# Patient Record
Sex: Female | Born: 1950 | Race: Black or African American | Hispanic: No | Marital: Married | Smoking: Never smoker
Health system: Southern US, Community
[De-identification: ages and names within clinical notes are randomized; demographics above are authoritative.]

## PROBLEM LIST (undated history)

## (undated) DIAGNOSIS — K529 Noninfective gastroenteritis and colitis, unspecified: Secondary | ICD-10-CM

## (undated) DIAGNOSIS — K635 Polyp of colon: Secondary | ICD-10-CM

## (undated) DIAGNOSIS — I10 Essential (primary) hypertension: Secondary | ICD-10-CM

## (undated) DIAGNOSIS — E785 Hyperlipidemia, unspecified: Secondary | ICD-10-CM

## (undated) DIAGNOSIS — M5090 Cervical disc disorder, unspecified, unspecified cervical region: Secondary | ICD-10-CM

## (undated) DIAGNOSIS — K219 Gastro-esophageal reflux disease without esophagitis: Secondary | ICD-10-CM

## (undated) DIAGNOSIS — K222 Esophageal obstruction: Secondary | ICD-10-CM

## (undated) HISTORY — DX: Cervical disc disorder, unspecified, unspecified cervical region: M50.90

## (undated) HISTORY — PX: COLONOSCOPY: SHX174

## (undated) HISTORY — DX: Gastro-esophageal reflux disease without esophagitis: K21.9

## (undated) HISTORY — PX: APPENDECTOMY: SHX54

## (undated) HISTORY — DX: Hyperlipidemia, unspecified: E78.5

## (undated) HISTORY — PX: BREAST LUMPECTOMY: SHX2

## (undated) HISTORY — DX: Polyp of colon: K63.5

## (undated) HISTORY — PX: CHOLECYSTECTOMY: SHX55

## (undated) HISTORY — DX: Esophageal obstruction: K22.2

---

## 1998-07-02 ENCOUNTER — Ambulatory Visit (HOSPITAL_COMMUNITY): Admission: RE | Admit: 1998-07-02 | Discharge: 1998-07-02 | Payer: Self-pay | Admitting: Gastroenterology

## 1999-07-01 ENCOUNTER — Other Ambulatory Visit: Admission: RE | Admit: 1999-07-01 | Discharge: 1999-07-01 | Payer: Self-pay | Admitting: *Deleted

## 2000-11-29 ENCOUNTER — Other Ambulatory Visit: Admission: RE | Admit: 2000-11-29 | Discharge: 2000-11-29 | Payer: Self-pay | Admitting: Obstetrics and Gynecology

## 2002-01-04 ENCOUNTER — Other Ambulatory Visit: Admission: RE | Admit: 2002-01-04 | Discharge: 2002-01-04 | Payer: Self-pay | Admitting: Obstetrics and Gynecology

## 2002-07-22 ENCOUNTER — Ambulatory Visit (HOSPITAL_COMMUNITY): Admission: RE | Admit: 2002-07-22 | Discharge: 2002-07-22 | Payer: Self-pay | Admitting: Gastroenterology

## 2002-08-01 ENCOUNTER — Ambulatory Visit (HOSPITAL_COMMUNITY): Admission: RE | Admit: 2002-08-01 | Discharge: 2002-08-01 | Payer: Self-pay | Admitting: Gastroenterology

## 2003-01-27 ENCOUNTER — Ambulatory Visit (HOSPITAL_COMMUNITY): Admission: RE | Admit: 2003-01-27 | Discharge: 2003-01-27 | Payer: Self-pay | Admitting: Gastroenterology

## 2003-12-23 ENCOUNTER — Other Ambulatory Visit: Admission: RE | Admit: 2003-12-23 | Discharge: 2003-12-23 | Payer: Self-pay | Admitting: Obstetrics and Gynecology

## 2006-03-15 ENCOUNTER — Other Ambulatory Visit: Admission: RE | Admit: 2006-03-15 | Discharge: 2006-03-15 | Payer: Self-pay | Admitting: Obstetrics and Gynecology

## 2011-11-09 ENCOUNTER — Emergency Department (HOSPITAL_COMMUNITY)
Admission: EM | Admit: 2011-11-09 | Discharge: 2011-11-09 | Disposition: A | Discharge status: Home / Self Care | Payer: No Typology Code available for payment source | Attending: Emergency Medicine | Admitting: Emergency Medicine

## 2011-11-09 ENCOUNTER — Emergency Department (HOSPITAL_COMMUNITY): Payer: No Typology Code available for payment source

## 2011-11-09 ENCOUNTER — Encounter (HOSPITAL_COMMUNITY): Payer: Self-pay | Admitting: Emergency Medicine

## 2011-11-09 DIAGNOSIS — R42 Dizziness and giddiness: Secondary | ICD-10-CM | POA: Insufficient documentation

## 2011-11-09 DIAGNOSIS — I10 Essential (primary) hypertension: Secondary | ICD-10-CM | POA: Insufficient documentation

## 2011-11-09 DIAGNOSIS — S20219A Contusion of unspecified front wall of thorax, initial encounter: Secondary | ICD-10-CM

## 2011-11-09 DIAGNOSIS — R51 Headache: Secondary | ICD-10-CM | POA: Insufficient documentation

## 2011-11-09 DIAGNOSIS — S0990XA Unspecified injury of head, initial encounter: Secondary | ICD-10-CM

## 2011-11-09 DIAGNOSIS — E119 Type 2 diabetes mellitus without complications: Secondary | ICD-10-CM | POA: Insufficient documentation

## 2011-11-09 HISTORY — DX: Noninfective gastroenteritis and colitis, unspecified: K52.9

## 2011-11-09 HISTORY — DX: Essential (primary) hypertension: I10

## 2011-11-09 NOTE — ED Provider Notes (Signed)
Medical screening examination/treatment/procedure(s) were performed by non-physician practitioner and as supervising physician I was immediately available for consultation/collaboration.  Gerhard Munch, MD 11/09/11 2302
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
## 2014-12-03 DIAGNOSIS — H2513 Age-related nuclear cataract, bilateral: Secondary | ICD-10-CM | POA: Diagnosis not present

## 2014-12-03 DIAGNOSIS — E119 Type 2 diabetes mellitus without complications: Secondary | ICD-10-CM | POA: Diagnosis not present

## 2015-03-13 DIAGNOSIS — K219 Gastro-esophageal reflux disease without esophagitis: Secondary | ICD-10-CM | POA: Diagnosis not present

## 2015-03-13 DIAGNOSIS — I10 Essential (primary) hypertension: Secondary | ICD-10-CM | POA: Diagnosis not present

## 2015-03-13 DIAGNOSIS — K5100 Ulcerative (chronic) pancolitis without complications: Secondary | ICD-10-CM | POA: Diagnosis not present

## 2015-03-13 DIAGNOSIS — E782 Mixed hyperlipidemia: Secondary | ICD-10-CM | POA: Diagnosis not present

## 2015-03-13 DIAGNOSIS — Z0000 Encounter for general adult medical examination without abnormal findings: Secondary | ICD-10-CM | POA: Diagnosis not present

## 2015-03-13 DIAGNOSIS — Z780 Asymptomatic menopausal state: Secondary | ICD-10-CM | POA: Diagnosis not present

## 2015-03-13 DIAGNOSIS — Z23 Encounter for immunization: Secondary | ICD-10-CM | POA: Diagnosis not present

## 2015-03-13 DIAGNOSIS — Z1389 Encounter for screening for other disorder: Secondary | ICD-10-CM | POA: Diagnosis not present

## 2015-03-13 DIAGNOSIS — E119 Type 2 diabetes mellitus without complications: Secondary | ICD-10-CM | POA: Diagnosis not present

## 2015-04-14 DIAGNOSIS — Z780 Asymptomatic menopausal state: Secondary | ICD-10-CM | POA: Diagnosis not present

## 2015-06-18 DIAGNOSIS — Z1211 Encounter for screening for malignant neoplasm of colon: Secondary | ICD-10-CM | POA: Diagnosis not present

## 2015-06-18 DIAGNOSIS — K5289 Other specified noninfective gastroenteritis and colitis: Secondary | ICD-10-CM | POA: Diagnosis not present

## 2015-06-18 DIAGNOSIS — D123 Benign neoplasm of transverse colon: Secondary | ICD-10-CM | POA: Diagnosis not present

## 2015-06-18 DIAGNOSIS — K635 Polyp of colon: Secondary | ICD-10-CM | POA: Diagnosis not present

## 2015-06-18 DIAGNOSIS — K529 Noninfective gastroenteritis and colitis, unspecified: Secondary | ICD-10-CM | POA: Diagnosis not present

## 2015-06-18 DIAGNOSIS — Z86010 Personal history of colonic polyps: Secondary | ICD-10-CM | POA: Diagnosis not present

## 2015-06-30 DIAGNOSIS — Z1231 Encounter for screening mammogram for malignant neoplasm of breast: Secondary | ICD-10-CM | POA: Diagnosis not present

## 2015-10-13 ENCOUNTER — Encounter: Payer: Self-pay | Admitting: Cardiovascular Disease

## 2015-10-20 ENCOUNTER — Ambulatory Visit (INDEPENDENT_AMBULATORY_CARE_PROVIDER_SITE_OTHER): Payer: Medicare Other | Admitting: Cardiovascular Disease

## 2015-10-20 ENCOUNTER — Encounter: Payer: Self-pay | Admitting: Cardiovascular Disease

## 2015-10-20 DIAGNOSIS — R079 Chest pain, unspecified: Secondary | ICD-10-CM | POA: Insufficient documentation

## 2015-10-20 DIAGNOSIS — R0789 Other chest pain: Secondary | ICD-10-CM

## 2015-10-20 DIAGNOSIS — E785 Hyperlipidemia, unspecified: Secondary | ICD-10-CM

## 2015-10-20 DIAGNOSIS — M79609 Pain in unspecified limb: Secondary | ICD-10-CM | POA: Diagnosis not present

## 2015-10-20 DIAGNOSIS — I10 Essential (primary) hypertension: Secondary | ICD-10-CM | POA: Diagnosis not present

## 2015-10-20 DIAGNOSIS — M79602 Pain in left arm: Secondary | ICD-10-CM | POA: Diagnosis not present

## 2015-10-20 DIAGNOSIS — E119 Type 2 diabetes mellitus without complications: Secondary | ICD-10-CM | POA: Insufficient documentation

## 2015-10-20 NOTE — Progress Notes (Signed)
Cardiology Office Note   Date:  10/20/2015   ID:  Cynthia Malone, DOB Nov 02, 1950, MRN [id number]  PCP:  Georgann Housekeeper, MD  Cardiologist:   Vesta Mixer, MD   Chief Complaint  Patient presents with  . Abnormal ECG    problem list 1. Abnormal EKG 2. Hypertension 3. Diabetes mellitus  4. Hyperlipidemia   History of Present Illness: Cynthia Malone is a 66 y.o. female who presents for evaluation of an abnormal ECG I have reviewed the records from [place].   They reveal dx of HTN, DM, hyperlipidemia  She has been having left arm pain .   Also has GERD issues. The old ECGs were not included in the records.  Does not get any regular exercise.    Works as an Oncologist at [employer]   Has left arm pain .   Not intense.  Shooting pain . Not associated with exertion . Lasts for 3-4 minutes.   Seems to radiate down her arm.   Has had DM for 10-15 years.     Past Medical History  Diagnosis Date  . Hypertension   . Diabetes mellitus   . Colitis   . Dyslipidemia   . Esophageal reflux   . Esophageal stricture     and dilation  . Cervical disc disease   . Colon polyp     Past Surgical History  Procedure Laterality Date  . Breast lumpectomy Left     2005  . Appendectomy      1960  . Cholecystectomy      2004  . Colonoscopy       Current Outpatient Prescriptions  Medication Sig Dispense Refill  . lisinopril-hydrochlorothiazide (PRINZIDE,ZESTORETIC) 20-25 MG per tablet Take 1 tablet by mouth daily.    . mesalamine (LIALDA) 1.2 G EC tablet Take 1,200 mg by mouth 2 (two) times daily.    . nitroGLYCERIN (NITROSTAT) 0.4 MG SL tablet Place 0.4 mg under the tongue as needed. AS NEEDED FOR CHEST PAIN    . omeprazole (PRILOSEC) 20 MG capsule Take 20 mg by mouth every morning.    . pravastatin (PRAVACHOL) 20 MG tablet Take 20 mg by mouth daily.    . sitaGLIPtan-metformin (JANUMET) 50-1000 MG per tablet Take 0.5 tablets by mouth 2 (two) times daily with a meal.     No current  facility-administered medications for this visit.    Allergies:   Review of patient's allergies indicates no known allergies.    Social History:  The patient  reports that she has never smoked. She does not have any smokeless tobacco history on file. She reports that she does not drink alcohol or use illicit drugs.   Family History:  The patient's family history includes CAD in her father; Diabetes Mellitus I in her sister; Heart Problems in her father; Heart attack (age of onset: 86) in her mother; Hypertension in her sister.    ROS:  Please see the history of present illness.    Review of Systems: Constitutional:  denies fever, chills, diaphoresis, appetite change and fatigue.  HEENT: denies photophobia, eye pain, redness, hearing loss, ear pain, congestion, sore throat, rhinorrhea, sneezing, neck pain, neck stiffness and tinnitus.  Respiratory: denies SOB, DOE, cough, chest tightness, and wheezing.  Cardiovascular: denies chest pain, palpitations and leg swelling.  Gastrointestinal: denies nausea, vomiting, abdominal pain, diarrhea, constipation, blood in stool.  Genitourinary: denies dysuria, urgency, frequency, hematuria, flank pain and difficulty urinating.  Musculoskeletal: denies  myalgias, back pain, joint swelling, arthralgias  and gait problem.   Skin: denies pallor, rash and wound.  Neurological: denies dizziness, seizures, syncope, weakness, light-headedness, numbness and headaches.   Hematological: denies adenopathy, easy bruising, personal or family bleeding history.  Psychiatric/ Behavioral: denies suicidal ideation, mood changes, confusion, nervousness, sleep disturbance and agitation.       All other systems are reviewed and negative.    PHYSICAL EXAM: VS:  BP 122/84 mmHg  Pulse 80  Ht  (1.575 m)  Wt 172 lb (78.019 kg)  BMI 31.45 kg/m2 , BMI Body mass index is 31.45 kg/(m^2). GEN: Well nourished, well developed, in no acute distress HEENT: normal Neck:  no JVD, carotid bruits, or masses Cardiac: RRR; very soft systolic  murmur,  No rubs, or gallops,no edema  Respiratory:  clear to auscultation bilaterally, normal work of breathing GI: soft, nontender, nondistended, + BS MS: no deformity or atrophy Skin: warm and dry, no rash Neuro:  Strength and sensation are intact Psych: normal   EKG:  EKG is ordered today. The ekg ordered today demonstrates  NSR at 80.  Normal ECG   Recent Labs: No results found for requested labs within last 365 days.    Lipid Panel No results found for: CHOL, TRIG, HDL, CHOLHDL, VLDL, LDLCALC, LDLDIRECT    Wt Readings from Last 3 Encounters:  10/20/15 172 lb (78.019 kg)      Other studies Reviewed: Additional studies/ records that were reviewed today include: . Review of the above records demonstrates:    ASSESSMENT AND PLAN:  1.  Left arm pain .   Shooting pain .  Last 3-4 minutes.   Not associated with neck pain . Has several risk factors ( DM , HTN, hyperlipidemia) also + family hx of CAD This pain certainly could be an angina equivilent.  I think we should proceed with a stress myoview  Will see her on an as needed  Unless the stress myoivew is abnormal     Current medicines are reviewed at length with the patient today.  The patient does not have concerns regarding medicines.  The following changes have been made:  no change  Labs/ tests ordered today include:  No orders of the defined types were placed in this encounter.     Disposition:   FU with me on an as needed basis if the myoview is normal. Will see her back soon if the myoview is abnormal       Tosca Pletz, Deloris Ping, MD  10/20/2015 3:42 PM    [facility] [address] [place], [place], [place]  [address] Phone: [phone]; Fax: [phone]   [facility]  [address] [place], [place]  [address] [phone]   Fax [phone]

## 2015-10-20 NOTE — Patient Instructions (Addendum)
Medication Instructions:  Your physician recommends that you continue on your current medications as directed. Please refer to the Current Medication list given to you today.   Labwork: None Ordered   Testing/Procedures: Your physician has requested that you have an exercise stress myoview. For further information please visit [email or web address]. Please follow instruction sheet, as given.   Follow-Up: Your physician recommends that you schedule a follow-up appointment in: as needed with Dr. Nahser.    If you need a refill on your cardiac medications before your next appointment, please call your pharmacy.   Thank you for choosing [facility]! Michelle Swinyer, RN [phone]    

## 2015-11-03 ENCOUNTER — Encounter (HOSPITAL_COMMUNITY): Payer: Self-pay

## 2015-11-04 ENCOUNTER — Telehealth (HOSPITAL_COMMUNITY): Payer: Self-pay | Admitting: *Deleted

## 2015-11-04 NOTE — Telephone Encounter (Signed)
Left message on voicemail in reference to upcoming appointment scheduled for 11/10/15. Phone number given for a call back so details instructions can be given. Antionette Char, RN

## 2015-11-05 ENCOUNTER — Telehealth (HOSPITAL_COMMUNITY): Payer: Self-pay | Admitting: *Deleted

## 2015-11-05 NOTE — Telephone Encounter (Signed)
Left message on voicemail in reference to upcoming appointment scheduled for 11/10/15. Phone number given for a call back so details instructions can be given. Roxann Vierra, Adelene Idler

## 2015-11-09 ENCOUNTER — Telehealth (HOSPITAL_COMMUNITY): Payer: Self-pay | Admitting: Radiology

## 2015-11-09 NOTE — Telephone Encounter (Signed)
Patient given detailed instructions per Myocardial Perfusion Study Information Sheet for the test on 11/10/2015 at 10:00. Patient notified to arrive 15 minutes early and that it is imperative to arrive on time for appointment to keep from having the test rescheduled.  If you need to cancel or reschedule your appointment, please call the office within 24 hours of your appointment. Failure to do so may result in a cancellation of your appointment, and a $50 no show fee. Patient verbalized understanding.EHK

## 2015-11-10 ENCOUNTER — Ambulatory Visit (HOSPITAL_COMMUNITY): Payer: Medicare Other | Attending: Cardiovascular Disease

## 2015-11-10 DIAGNOSIS — R0609 Other forms of dyspnea: Secondary | ICD-10-CM | POA: Insufficient documentation

## 2015-11-10 DIAGNOSIS — E119 Type 2 diabetes mellitus without complications: Secondary | ICD-10-CM | POA: Diagnosis not present

## 2015-11-10 DIAGNOSIS — I10 Essential (primary) hypertension: Secondary | ICD-10-CM | POA: Diagnosis not present

## 2015-11-10 DIAGNOSIS — E785 Hyperlipidemia, unspecified: Secondary | ICD-10-CM | POA: Insufficient documentation

## 2015-11-10 DIAGNOSIS — R0789 Other chest pain: Secondary | ICD-10-CM | POA: Diagnosis not present

## 2015-11-10 DIAGNOSIS — M79602 Pain in left arm: Secondary | ICD-10-CM | POA: Diagnosis not present

## 2015-11-10 LAB — MYOCARDIAL PERFUSION IMAGING
Estimated workload: 7 METS
Exercise duration (min): 5 min
Exercise duration (sec): 0 s
LV dias vol: 48 mL
LV sys vol: 7 mL
MPHR: 154 {beats}/min
Peak HR: 150 {beats}/min
Percent HR: 97 %
RATE: 0.26
Rest HR: 76 {beats}/min
SDS: 9
SRS: 4
SSS: 13
TID: 0.91

## 2015-11-10 MED ORDER — TECHNETIUM TC 99M SESTAMIBI GENERIC - CARDIOLITE
30.8000 | Freq: Once | INTRAVENOUS | Status: AC | PRN
  Administered 2015-11-10: 30.8 via INTRAVENOUS

## 2015-11-10 MED ORDER — TECHNETIUM TC 99M SESTAMIBI GENERIC - CARDIOLITE
10.4000 | Freq: Once | INTRAVENOUS | Status: AC | PRN
  Administered 2015-11-10: 10 via INTRAVENOUS

## 2016-07-05 ENCOUNTER — Other Ambulatory Visit (HOSPITAL_COMMUNITY)
Admission: RE | Admit: 2016-07-05 | Discharge: 2016-07-05 | Disposition: A | Discharge status: Home / Self Care | Payer: Medicare Other | Source: Ambulatory Visit | Attending: Obstetrics and Gynecology | Admitting: Obstetrics and Gynecology

## 2016-07-05 ENCOUNTER — Other Ambulatory Visit: Payer: Self-pay | Admitting: Obstetrics and Gynecology

## 2016-07-05 DIAGNOSIS — Z01419 Encounter for gynecological examination (general) (routine) without abnormal findings: Secondary | ICD-10-CM | POA: Insufficient documentation

## 2016-07-05 DIAGNOSIS — Z1151 Encounter for screening for human papillomavirus (HPV): Secondary | ICD-10-CM | POA: Insufficient documentation

## 2016-07-07 LAB — CYTOLOGY - PAP

## 2019-03-01 ENCOUNTER — Emergency Department (HOSPITAL_COMMUNITY): Payer: Medicare Other

## 2019-03-01 ENCOUNTER — Emergency Department (HOSPITAL_COMMUNITY)
Admission: EM | Admit: 2019-03-01 | Discharge: 2019-03-01 | Disposition: A | Discharge status: Home / Self Care | Payer: Medicare Other | Attending: Emergency Medicine | Admitting: Emergency Medicine

## 2019-03-01 ENCOUNTER — Encounter (HOSPITAL_COMMUNITY): Payer: Self-pay

## 2019-03-01 ENCOUNTER — Other Ambulatory Visit: Payer: Self-pay

## 2019-03-01 DIAGNOSIS — R0989 Other specified symptoms and signs involving the circulatory and respiratory systems: Secondary | ICD-10-CM | POA: Insufficient documentation

## 2019-03-01 DIAGNOSIS — E119 Type 2 diabetes mellitus without complications: Secondary | ICD-10-CM | POA: Diagnosis not present

## 2019-03-01 DIAGNOSIS — I10 Essential (primary) hypertension: Secondary | ICD-10-CM | POA: Insufficient documentation

## 2019-03-01 DIAGNOSIS — Z79899 Other long term (current) drug therapy: Secondary | ICD-10-CM | POA: Diagnosis not present

## 2019-03-01 DIAGNOSIS — Z7984 Long term (current) use of oral hypoglycemic drugs: Secondary | ICD-10-CM | POA: Diagnosis not present

## 2019-03-01 MED ORDER — SODIUM CHLORIDE 0.9 % IV BOLUS: 1000.0000 mL | Freq: Once | INTRAVENOUS | Status: DC

## 2019-03-01 MED ORDER — GLUCAGON HCL RDNA (DIAGNOSTIC) 1 MG IJ SOLR: 0.5000 mg | Freq: Once | INTRAMUSCULAR | Status: DC

## 2019-03-01 MED ORDER — METOCLOPRAMIDE HCL 5 MG/ML IJ SOLN
10.0000 mg | Freq: Once | INTRAMUSCULAR | Status: DC
  Filled 2019-03-01: qty 2

## 2019-03-01 MED ORDER — DIPHENHYDRAMINE HCL 50 MG/ML IJ SOLN
12.5000 mg | Freq: Once | INTRAMUSCULAR | Status: DC
  Filled 2019-03-01: qty 1

## 2019-03-01 MED ORDER — LIDOCAINE VISCOUS HCL 2 % MT SOLN
15.0000 mL | Freq: Once | OROMUCOSAL | Status: AC
  Administered 2019-03-01: 16:00:00 15 mL via ORAL
  Filled 2019-03-01: qty 15

## 2019-03-01 MED ORDER — ONDANSETRON HCL 4 MG/2ML IJ SOLN
4.0000 mg | Freq: Once | INTRAMUSCULAR | Status: DC
  Filled 2019-03-01: qty 2

## 2019-03-01 MED ORDER — FAMOTIDINE IN NACL 20-0.9 MG/50ML-% IV SOLN
20.0000 mg | Freq: Once | INTRAVENOUS | Status: DC
  Filled 2019-03-01: qty 50

## 2019-03-01 MED ORDER — ALUM & MAG HYDROXIDE-SIMETH 200-200-20 MG/5ML PO SUSP
30.0000 mL | Freq: Once | ORAL | Status: AC
  Administered 2019-03-01: 30 mL via ORAL
  Filled 2019-03-01: qty 30

## 2019-03-01 NOTE — ED Notes (Signed)
Patient verbalizes understanding of discharge instructions. Opportunity for questioning and answers were provided. Pt discharged from ED. 

## 2019-03-01 NOTE — ED Notes (Signed)
Pt tolerating PO fluids

## 2019-03-01 NOTE — ED Triage Notes (Addendum)
Pt from home; pt states that she was eating a steak this am and got a piece stuck in her throat; pt states she does not feel the piece of steak in her throat anymore, but is having difficulty swallowing; endorses some pain in her throat and upper chest; denies difficulty breathing; pt in NAD at this time; hx gerd; pt also states that prior to eating the steak, she cleaned fridge with a mixture of ammonia and bleach; has been coughing since

## 2019-03-01 NOTE — ED Provider Notes (Signed)
[facility] EMERGENCY DEPARTMENT Provider Note   CSN: [id number] Arrival date & time: 03/01/19  1430    History   Chief Complaint No chief complaint on file.   HPI Cynthia Malone is a 68 y.o. female hx of DM, esophageal stricture s/p dilation (GI at [facility]), here with possible food impaction.  Patient states that she ate a piece of steak earlier today and then felt that it was stuck in her throat in her chest.  She states that she coughed up pieces of steak afterwards.  She tried to drink some water but vomited up afterwards.  She states that she also has a history of reflux and had a burning sensation in her throat.  She states that she is able to tolerate her secretions and she had previous esophageal stricture that required dilation.  Her GI doctor is in [facility].      The history is provided by the patient.    Past Medical History:  Diagnosis Date   Cervical disc disease    Colitis    Colon polyp    Diabetes mellitus    Dyslipidemia    Esophageal reflux    Esophageal stricture    and dilation   Hypertension     Patient Active Problem List   Diagnosis Date Noted   Chest pain 10/20/2015   Diabetes mellitus (HCC) 10/20/2015    Past Surgical History:  Procedure Laterality Date   CESAREAN SECTION     COLONOSCOPY       OB History   No obstetric history on file.      Home Medications    Prior to Admission medications   Medication Sig Start Date End Date Taking? Authorizing Provider  lisinopril-hydrochlorothiazide (PRINZIDE,ZESTORETIC) 20-25 MG per tablet Take 1 tablet by mouth daily.    [provider]  mesalamine (LIALDA) 1.2 G EC tablet Take 1,200 mg by mouth 2 (two) times daily.    [provider]  nitroGLYCERIN (NITROSTAT) 0.4 MG SL tablet Place 0.4 mg under the tongue as needed. AS NEEDED FOR CHEST PAIN 10/06/15   [provider]  omeprazole (PRILOSEC) 20 MG capsule Take 20 mg  by mouth every morning.    [provider]  pravastatin (PRAVACHOL) 20 MG tablet Take 20 mg by mouth daily. 10/15/15   [provider]  sitaGLIPtan-metformin (JANUMET) 50-1000 MG per tablet Take 0.5 tablets by mouth 2 (two) times daily with a meal.    [provider]    Family History Family History  Problem Relation Age of Onset   Heart attack Mother 54   CAD Father    Heart Problems Father    Hypertension Sister    Diabetes Mellitus I Sister     Social History Social History   Tobacco Use   Smoking status: Never Smoker   Smokeless tobacco: Never Used  Substance Use Topics   Alcohol use: No    Alcohol/week: 0.0 standard drinks   Drug use: No     Allergies   Patient has no known allergies.   Review of Systems Review of Systems  Respiratory: Positive for cough.   All other systems reviewed and are negative.    Physical Exam Updated Vital Signs BP 122/71    Pulse 95    Temp 98.5 F (36.9 C) (Oral)    Resp 17    Ht 5' 5.5" (1.664 m)    Wt 72.1 kg    SpO2 98%  BMI 26.06 kg/m   Physical Exam Vitals signs and nursing note reviewed.  Constitutional:      Appearance: Normal appearance.  HENT:     Head: Normocephalic.     Right Ear: Tympanic membrane normal.     Left Ear: Tympanic membrane normal.     Nose: Nose normal.     Mouth/Throat:     Mouth: Mucous membranes are moist.     Comments: No obvious foreign body in posterior pharynx  Eyes:     Extraocular Movements: Extraocular movements intact.     Pupils: Pupils are equal, round, and reactive to light.  Neck:     Musculoskeletal: Normal range of motion.     Comments: No stridor  Cardiovascular:     Rate and Rhythm: Normal rate and regular rhythm.     Pulses: Normal pulses.     Heart sounds: Normal heart sounds.  Pulmonary:     Effort: Pulmonary effort is normal.     Breath sounds: Normal breath sounds.  Abdominal:     General: Abdomen is flat.     Palpations:  Abdomen is soft.  Musculoskeletal: Normal range of motion.  Skin:    General: Skin is warm.     Capillary Refill: Capillary refill takes less than 2 seconds.  Neurological:     General: No focal deficit present.     Mental Status: She is alert and oriented to person, place, and time.  Psychiatric:        Mood and Affect: Mood normal.        Behavior: Behavior normal.      ED Treatments / Results  Labs (all labs ordered are listed, but only abnormal results are displayed) Labs Reviewed - No data to display  EKG EKG Interpretation  Date/Time:  Friday Mar 01 2019 15:13:20 EDT Ventricular Rate:  87 PR Interval:    QRS Duration: 86 QT Interval:  384 QTC Calculation: 462 R Axis:   32 Text Interpretation:  Sinus rhythm Low voltage, precordial leads No previous ECGs available Confirmed by Richardean CanalYao, Eriyanna Kofoed H [phone]([id number]) on 03/01/2019 3:41:36 PM   Radiology Dg Neck Soft Tissue  Result Date: 03/01/2019 CLINICAL DATA:  Foreign body sensation in throat EXAM: NECK SOFT TISSUES - 1+ VIEW COMPARISON:  None. FINDINGS: There is no evidence of retropharyngeal soft tissue swelling or epiglottic enlargement. The cervical airway is unremarkable and no radio-opaque foreign body identified. Degenerative changes in the mid and lower cervical spine with disc space narrowing and spurring. IMPRESSION: No acute findings.  No radiopaque foreign body. Electronically Signed   By: Charlett NoseKevin  Dover M.D.   On: 03/01/2019 15:47   Dg Chest 2 View  Result Date: 03/01/2019 CLINICAL DATA:  Pt from home; pt states that she was eating a steak this am and got a piece stuck in her throat; pt states she does not feel the piece of steak in her throat anymore, but is having difficulty swallowing; endorses some pain in her.*comment was truncated*foreign body sensation EXAM: CHEST - 2 VIEW COMPARISON:  None. FINDINGS: Normal mediastinum and cardiac silhouette. Normal pulmonary vasculature. No evidence of effusion, infiltrate, or  pneumothorax. Trachea normal. No acute bony abnormality. IMPRESSION: No acute cardiopulmonary process. Electronically Signed   By: Genevive BiStewart  Edmunds M.D.   On: 03/01/2019 15:49    Procedures Procedures (including critical care time)  Medications Ordered in ED Medications  ondansetron (ZOFRAN) injection 4 mg (has no administration in time range)  sodium chloride 0.9 % bolus 1,000 mL (has  no administration in time range)  famotidine (PEPCID) IVPB 20 mg premix (has no administration in time range)  glucagon (human recombinant) (GLUCAGEN) injection 0.5 mg (has no administration in time range)  metoCLOPramide (REGLAN) injection 10 mg (has no administration in time range)  diphenhydrAMINE (BENADRYL) injection 12.5 mg (has no administration in time range)  alum & mag hydroxide-simeth (MAALOX/MYLANTA) 200-200-20 MG/5ML suspension 30 mL (30 mLs Oral Given 03/01/19 1558)    And  lidocaine (XYLOCAINE) 2 % viscous mouth solution 15 mL (15 mLs Oral Given 03/01/19 1558)     Initial Impression / Assessment and Plan / ED Course  I have reviewed the triage vital signs and the nursing notes.  Pertinent labs & imaging results that were available during my care of the patient were reviewed by me and considered in my medical decision making (see chart for details).       Cynthia Malone is a 68 y.o. female here with possible food impaction. Will get labs, CXR and neck xray. Will give glucagon, pepcid.   4:30 PM Patient's xrays unremarkable. States that she felt better and refused to glucagon.  Patient was able to tolerate GI cocktail and then some p.o. fluids afterwards.  I think she likely had just that globus sensation at this point.  I recommend soft diet for the next several days and outpatient GI follow-up.   Final Clinical Impressions(s) / ED Diagnoses   Final diagnoses:  None    ED Discharge Orders    None       Charlynne Pander, MD 03/01/19 [phone]

## 2019-03-01 NOTE — Discharge Instructions (Signed)
Stay hydrated.   Soft diet for several days   Make sure that you chew your food well   See your GI doctor for follow up   Return to ER if you have trouble swallowing, chest pain, cough, shortness of breath, vomiting.

## 2019-03-01 NOTE — ED Notes (Signed)
Pt states that she is feeling better and she does not want an IV, blood work, or iv meds/fluids; Dr. Silverio Lay notified

## 2019-07-19 IMAGING — CR NECK SOFT TISSUES - 1+ VIEW
2 series · 3 of 3 positions shown · non-contrast
Comparison: None.

CLINICAL DATA: Foreign body sensation in throat

EXAM:
NECK SOFT TISSUES - 1+ VIEW

[Series 1: neck lat · 0.14mm/px · 2 of 2 slices shown]
[im 1/2]
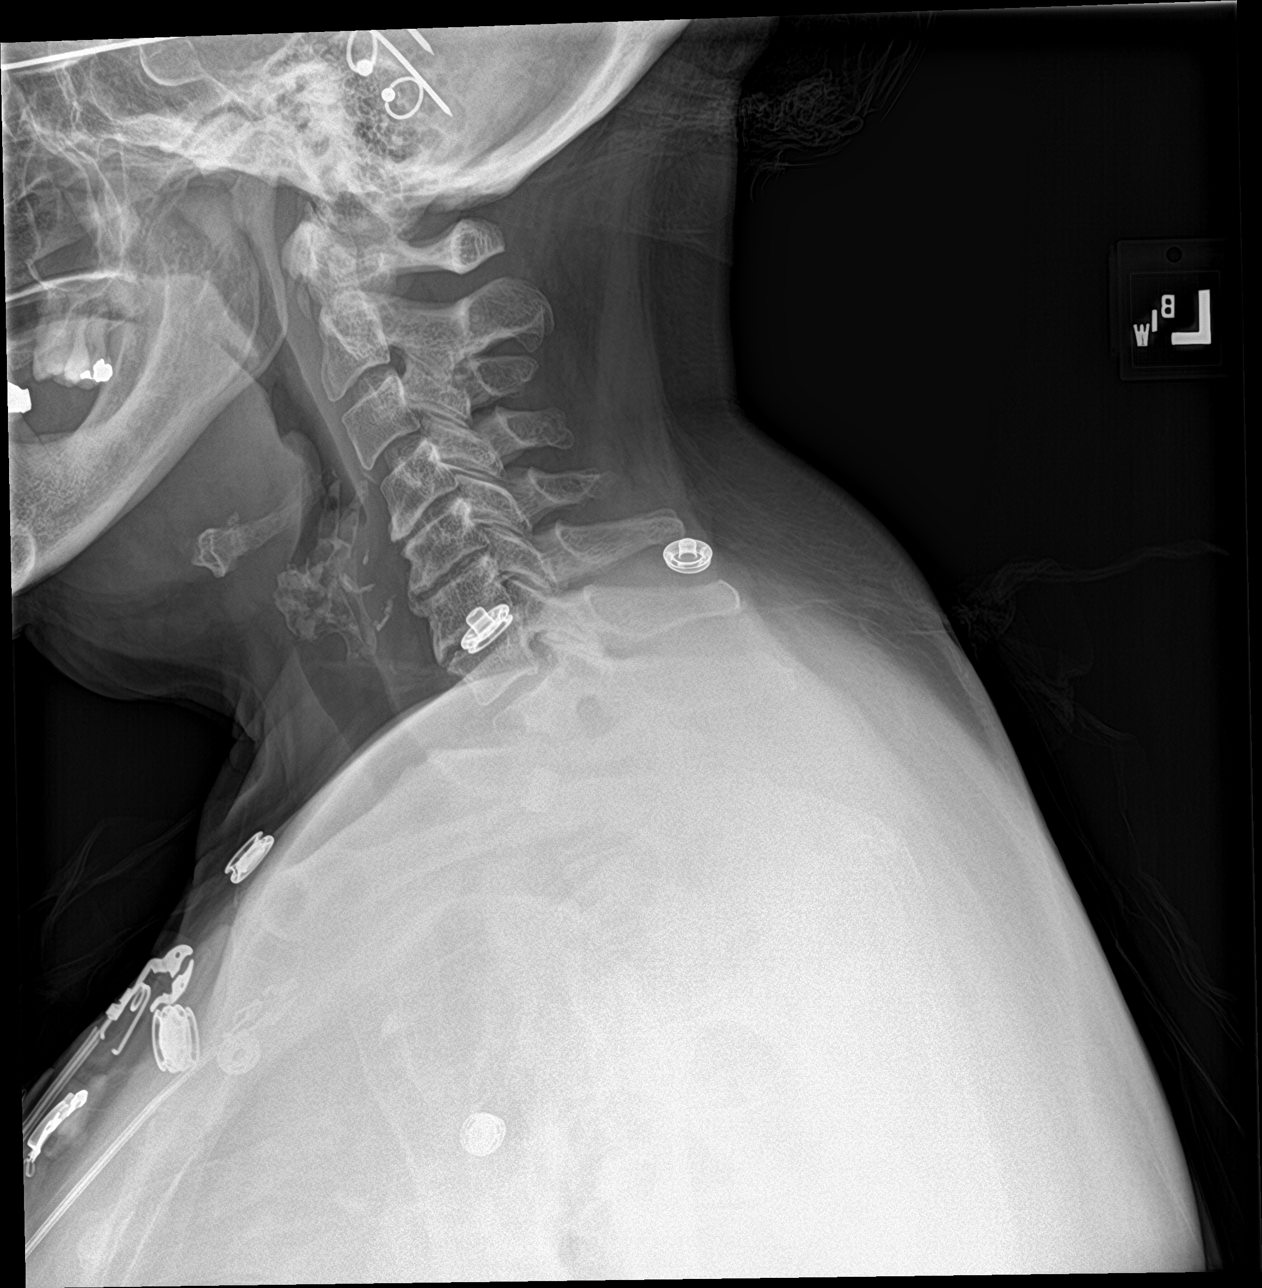
[im 2/2]
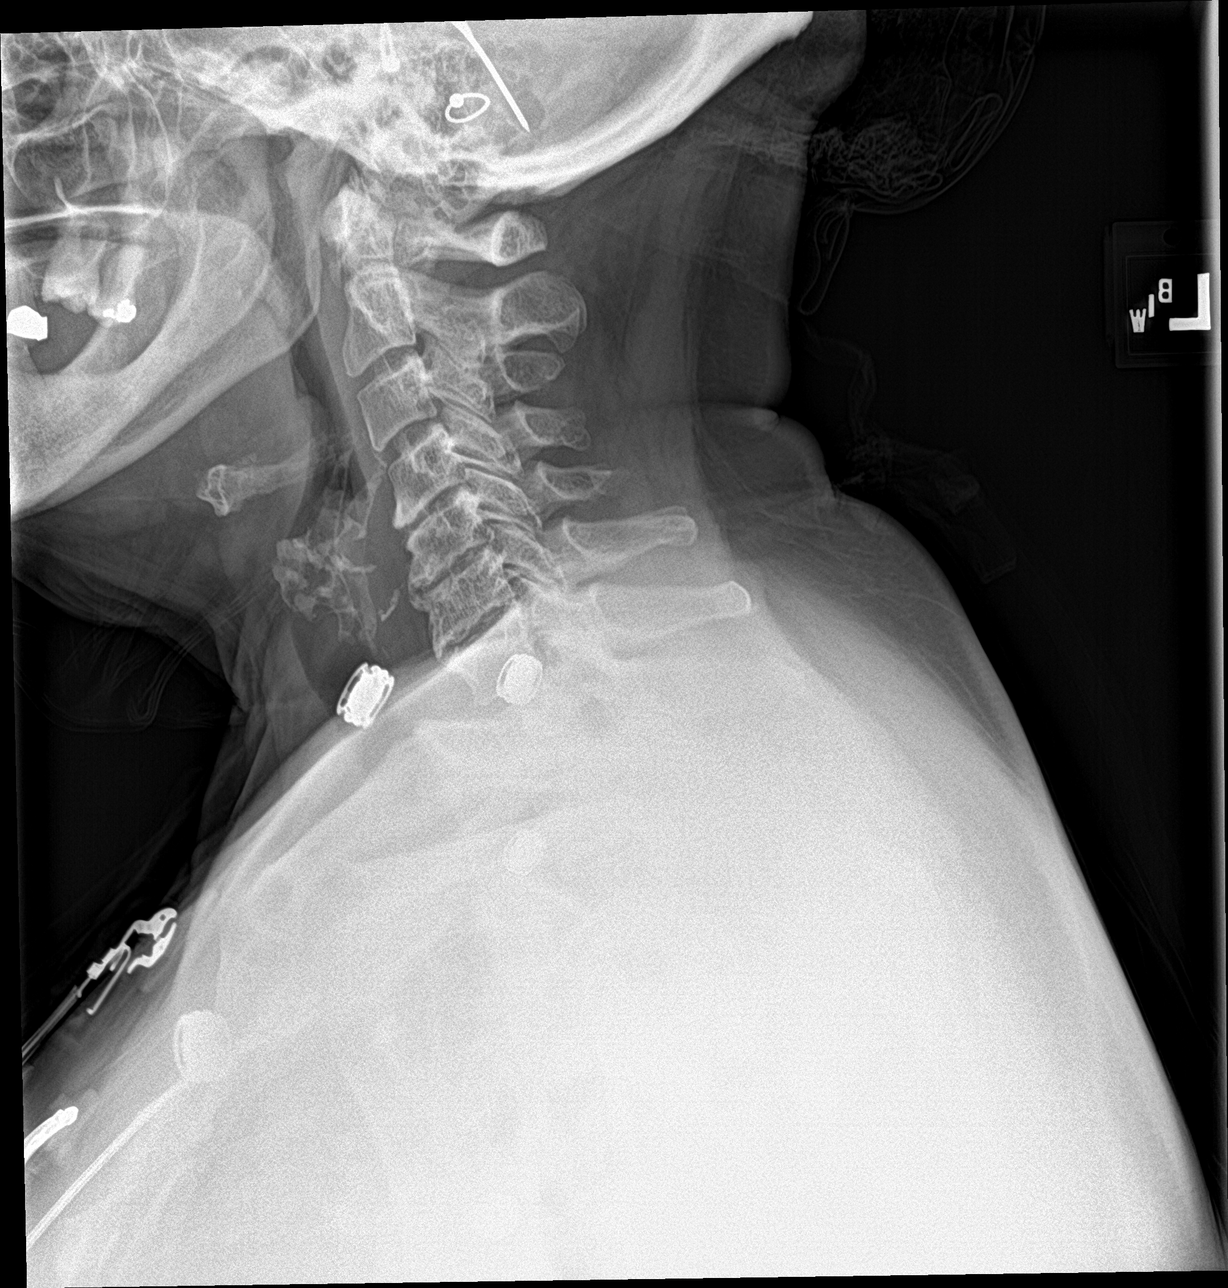

[neck ap]
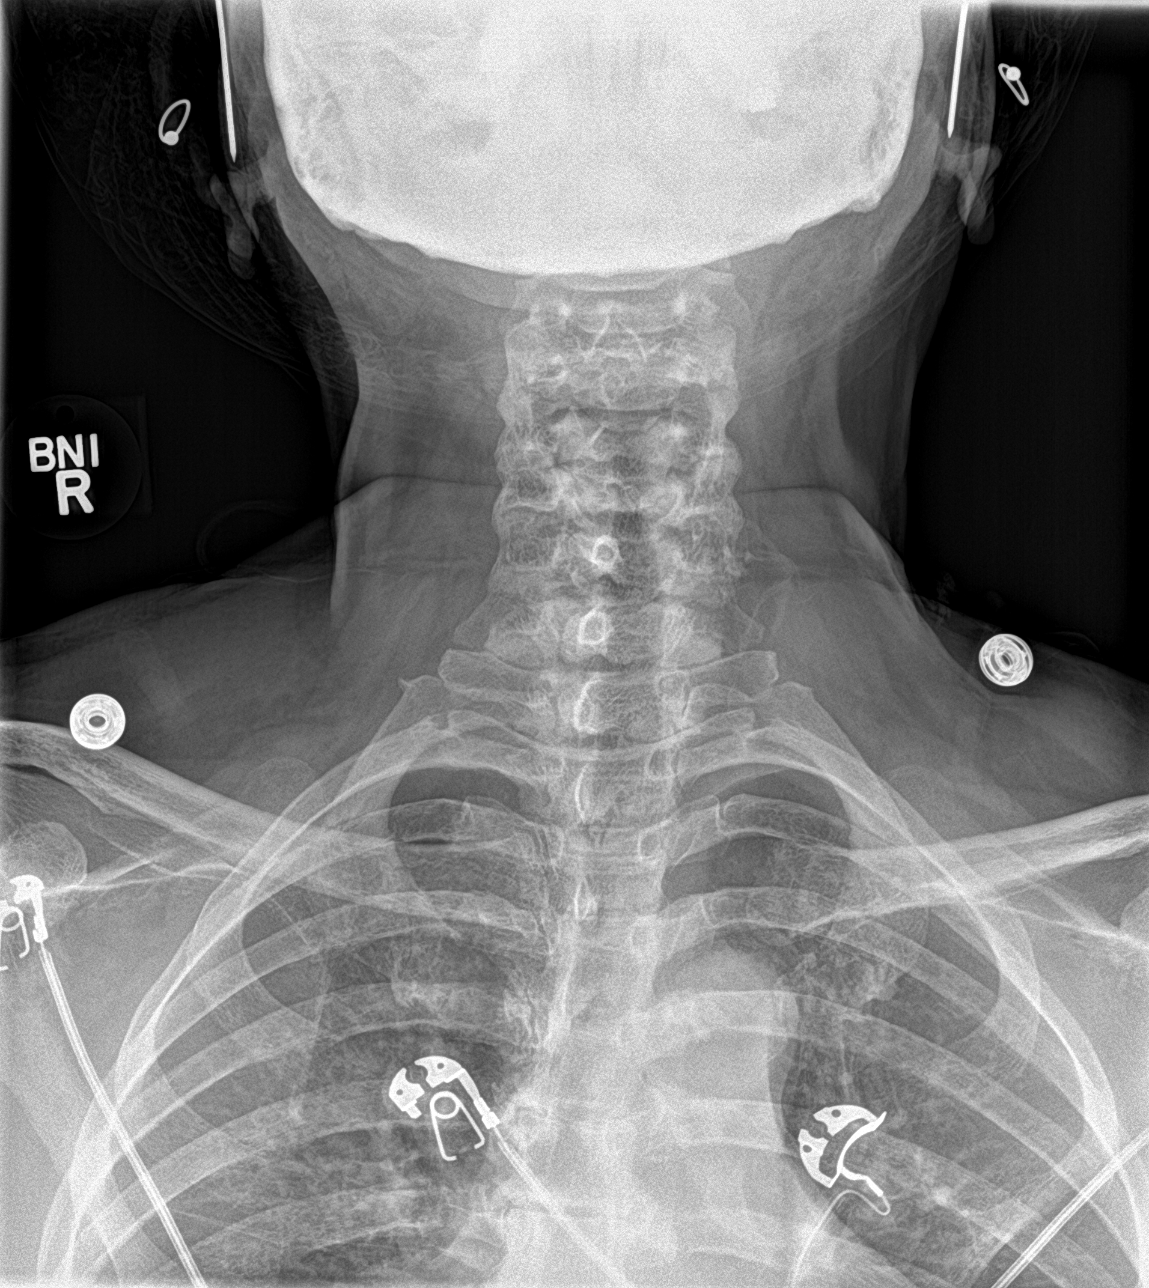

[3 of 3 positions shown; findings below may reference images not displayed]

FINDINGS: There is no evidence of retropharyngeal soft tissue swelling or
epiglottic enlargement. The cervical airway is unremarkable and no
radio-opaque foreign body identified. Degenerative changes in the
mid and lower cervical spine with disc space narrowing and spurring.
IMPRESSION: No acute findings.  No radiopaque foreign body.

## 2020-01-22 NOTE — Progress Notes (Signed)
Cardiology Office Note   Date:  01/30/2020   ID:  Cynthia Malone, DOB [birth year], MRN [id number]  PCP:  Georgann Housekeeper, MD  Cardiologist:   Ellyse Rotolo [place], MD   Chief Complaint  Patient presents with  . Chest Pain      History of Present Illness: Cynthia Malone is a 69 y.o. female who is seen at the request of Dr Donette Larry for evaluation of chest pain and abnormal Ecg. Was seen in early 2017 by Dr Elease Hashimoto with abnormal Ecg. Myoview was normal.   In early March she lost her grandson to a liver disorder. This was very stressful for her and during this time she developed severe chest pain at rest. Since that time her chest pain has completely resolved. She is still working part time at [employer] No exertional symptoms. No dyspnea.    Past Medical History:  Diagnosis Date  . Cervical disc disease   . Colitis   . Colon polyp   . Diabetes mellitus   . Dyslipidemia   . Esophageal reflux   . Esophageal stricture    and dilation  . Hyperlipidemia   . Hypertension     Past Surgical History:  Procedure Laterality Date  . CESAREAN SECTION    . COLONOSCOPY       Current Outpatient Medications  Medication Sig Dispense Refill  . lisinopril-hydrochlorothiazide (PRINZIDE,ZESTORETIC) 20-25 MG per tablet Take 1 tablet by mouth daily.    . mesalamine (LIALDA) 1.2 G EC tablet Take 1,200 mg by mouth 2 (two) times daily.    . nitroGLYCERIN (NITROSTAT) 0.4 MG SL tablet Place 0.4 mg under the tongue as needed. AS NEEDED FOR CHEST PAIN    . omeprazole (PRILOSEC) 20 MG capsule Take 20 mg by mouth every morning.    . pravastatin (PRAVACHOL) 20 MG tablet Take 20 mg by mouth daily.    . sitaGLIPtan-metformin (JANUMET) 50-1000 MG per tablet Take 0.5 tablets by mouth 2 (two) times daily with a meal.     No current facility-administered medications for this visit.    Allergies:   Patient has no known allergies.    Social History:  The patient  reports that she has never smoked. She has never used  smokeless tobacco. She reports that she does not drink alcohol or use drugs.   Family History:  The patient's family history includes CAD in her father; Diabetes Mellitus I in her sister; Heart Problems in her father; Heart attack (age of onset: 18) in her mother; Heart disease in her father; Hypertension in her sister.    ROS:  Please see the history of present illness.   Otherwise, review of systems are positive for none.   All other systems are reviewed and negative.    PHYSICAL EXAM: VS:  BP 113/68   Pulse 90   Temp (!) 97 F (36.1 C)   Ht 5\' 1"  (1.549 m)   Wt 173 lb 12.8 oz (78.8 kg)   SpO2 98%   BMI 32.84 kg/m  , BMI Body mass index is 32.84 kg/m. GEN: Well nourished, well developed, in no acute distress  HEENT: normal  Neck: no JVD, carotid bruits, or masses Cardiac: RRR; no murmurs, rubs, or gallops,no edema  Respiratory:  clear to auscultation bilaterally, normal work of breathing GI: soft, nontender, nondistended, + BS MS: no deformity or atrophy  Skin: warm and dry, no rash Neuro:  Strength and sensation are intact Psych: euthymic mood, full affect   EKG:  EKG is ordered today. The ekg ordered today demonstrates NSR with normal Ecg. Also reviewed Ecg from Dr Lysle Rubens on 12/30/19 and it was also normal. I have personally reviewed and interpreted this study.    Recent Labs: No results found for requested labs within last 8760 hours.    Lipid Panel No results found for: CHOL, TRIG, HDL, CHOLHDL, VLDL, LDLCALC, LDLDIRECT   Labs dated 10/30/18: cholesterol 184, triglycerides 134, HDL 52, LDL 103. A1c 7.3%. CBC, TSH, CMET normal   Wt Readings from Last 3 Encounters:  01/30/20 173 lb 12.8 oz (78.8 kg)  03/01/19 159 lb (72.1 kg)  11/10/15 172 lb (78 kg)      Other studies Reviewed: Additional studies/ records that were reviewed today include:   Myoview 11/10/15: Study Highlights   Nuclear stress EF: 86%.  There was no ST segment deviation noted during  stress.  The study is normal.  The left ventricular ejection fraction is hyperdynamic (>65%).   1. No evidence for ischemia or infarction.  2. Normal LV EF and wall motion.  3. Normal study.       ASSESSMENT AND PLAN:  1. Chest pain situational related to sudden grief of losing grandson. No subsequent pain. Ecg is normal. She does have cardiac risk factors of DM, HLD, and HTN. Myoview in 2017 was normal. At this point I would not recommend further evaluation unless she were to have recurrent symptoms. Continue to focus on risk factor modification. She is to let me know if she has further chest pain 2. DM  3. HTN controlled. 4. HLD on pravastatin. Labs per Dr Lysle Rubens.   Current medicines are reviewed at length with the patient today.  The patient does not have concerns regarding medicines.  The following changes have been made:  no change  Labs/ tests ordered today include:  No orders of the defined types were placed in this encounter.    Disposition:   FU with me PRN  Signed, Coline Calkin [place], MD  01/30/2020 10:58 AM    [facility] [address], [place], [place], [address] Phone [phone], Fax [phone]

## 2020-01-30 ENCOUNTER — Encounter: Payer: Self-pay | Admitting: Cardiology

## 2020-01-30 ENCOUNTER — Other Ambulatory Visit: Payer: Self-pay

## 2020-01-30 ENCOUNTER — Ambulatory Visit: Payer: Medicare PPO | Admitting: Cardiology

## 2020-01-30 VITALS — BP 113/68 | HR 90 | Temp 97.0°F | Ht 61.0 in | Wt 173.8 lb

## 2020-01-30 DIAGNOSIS — E7800 Pure hypercholesterolemia, unspecified: Secondary | ICD-10-CM

## 2020-01-30 DIAGNOSIS — I10 Essential (primary) hypertension: Secondary | ICD-10-CM

## 2020-01-30 DIAGNOSIS — R079 Chest pain, unspecified: Secondary | ICD-10-CM

## 2020-01-30 DIAGNOSIS — E119 Type 2 diabetes mellitus without complications: Secondary | ICD-10-CM

## 2020-02-25 ENCOUNTER — Other Ambulatory Visit: Payer: Self-pay | Admitting: Internal Medicine

## 2020-02-25 DIAGNOSIS — E2839 Other primary ovarian failure: Secondary | ICD-10-CM

## 2020-04-17 DIAGNOSIS — Z1231 Encounter for screening mammogram for malignant neoplasm of breast: Secondary | ICD-10-CM | POA: Diagnosis not present

## 2020-05-01 ENCOUNTER — Other Ambulatory Visit: Payer: Medicare PPO

## 2020-05-04 DIAGNOSIS — R921 Mammographic calcification found on diagnostic imaging of breast: Secondary | ICD-10-CM | POA: Diagnosis not present

## 2020-05-25 ENCOUNTER — Other Ambulatory Visit: Payer: Self-pay | Admitting: Radiology

## 2020-05-25 DIAGNOSIS — D242 Benign neoplasm of left breast: Secondary | ICD-10-CM | POA: Diagnosis not present

## 2020-07-10 ENCOUNTER — Other Ambulatory Visit: Payer: Self-pay

## 2020-07-10 ENCOUNTER — Encounter (HOSPITAL_COMMUNITY): Payer: Self-pay | Admitting: Emergency Medicine

## 2020-07-10 ENCOUNTER — Ambulatory Visit (HOSPITAL_COMMUNITY)
Admission: EM | Admit: 2020-07-10 | Discharge: 2020-07-10 | Disposition: A | Discharge status: Home / Self Care | Payer: Medicare PPO | Attending: Family Medicine | Admitting: Family Medicine

## 2020-07-10 DIAGNOSIS — S41111A Laceration without foreign body of right upper arm, initial encounter: Secondary | ICD-10-CM | POA: Diagnosis not present

## 2020-07-10 MED ORDER — TETANUS-DIPHTH-ACELL PERTUSSIS 5-2.5-18.5 LF-MCG/0.5 IM SUSP
INTRAMUSCULAR | Status: AC
  Filled 2020-07-10: qty 0.5

## 2020-07-10 MED ORDER — TETANUS-DIPHTH-ACELL PERTUSSIS 5-2.5-18.5 LF-MCG/0.5 IM SUSP
0.5000 mL | Freq: Once | INTRAMUSCULAR | Status: AC
  Administered 2020-07-10: 0.5 mL via INTRAMUSCULAR

## 2020-07-10 NOTE — Discharge Instructions (Addendum)
Tetanus updated We closed the wound with Dermabond today. Let this fall off on its own. Do not submerge in water but okay to get wet once completely dry Follow up as needed for continued or worsening symptoms

## 2020-07-10 NOTE — ED Triage Notes (Signed)
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 Will need a tetanus shot.

## 2020-07-13 NOTE — ED Provider Notes (Signed)
[facility]    CSN: [id number] Arrival date & time: 07/10/20  1155      History   Chief Complaint Chief Complaint  Patient presents with  . Extremity Laceration    HPI Cynthia Malone is a 69 y.o. female.   Patient is a 69 year old female who presents today with superficial laceration to right upper arm.  This occurred when a nail cut her skin.  She is here today for updated tetanus.  She cleaned the area.  Bleeding is controlled.     Past Medical History:  Diagnosis Date  . Cervical disc disease   . Colitis   . Colon polyp   . Diabetes mellitus   . Dyslipidemia   . Esophageal reflux   . Esophageal stricture    and dilation  . Hyperlipidemia   . Hypertension     Patient Active Problem List   Diagnosis Date Noted  . Chest pain 10/20/2015  . Diabetes mellitus (HCC) 10/20/2015    Past Surgical History:  Procedure Laterality Date  . CESAREAN SECTION    . COLONOSCOPY      OB History   No obstetric history on file.      Home Medications    Prior to Admission medications   Medication Sig Start Date End Date Taking? Authorizing Provider  lisinopril-hydrochlorothiazide (PRINZIDE,ZESTORETIC) 20-25 MG per tablet Take 1 tablet by mouth daily.   Yes [provider]  mesalamine (LIALDA) 1.2 G EC tablet Take 1,200 mg by mouth 2 (two) times daily.   Yes [provider]  omeprazole (PRILOSEC) 20 MG capsule Take 20 mg by mouth every morning.   Yes [provider]  pravastatin (PRAVACHOL) 20 MG tablet Take 20 mg by mouth daily. 10/15/15  Yes [provider]  sitaGLIPtan-metformin (JANUMET) 50-1000 MG per tablet Take 0.5 tablets by mouth 2 (two) times daily with a meal.   Yes [provider]  nitroGLYCERIN (NITROSTAT) 0.4 MG SL tablet Place 0.4 mg under the tongue as needed. AS NEEDED FOR CHEST PAIN 10/06/15   [provider]    Family History Family History  Problem Relation Age of Onset  . Heart attack  Mother 73  . CAD Father   . Heart Problems Father   . Heart disease Father   . Hypertension Sister   . Diabetes Mellitus I Sister     Social History Social History   Tobacco Use  . Smoking status: Never Smoker  . Smokeless tobacco: Never Used  Substance Use Topics  . Alcohol use: No    Alcohol/week: 0.0 standard drinks  . Drug use: No     Allergies   Patient has no known allergies.   Review of Systems Review of Systems   Physical Exam Triage Vital Signs ED Triage Vitals  Enc Vitals Group     BP 07/10/20 1233 136/83     Pulse Rate 07/10/20 1233 90     Resp 07/10/20 1233 16     Temp 07/10/20 1233 98.2 F (36.8 C)     Temp Source 07/10/20 1233 Oral     SpO2 07/10/20 1233 98 %     Weight --      Height --      Head Circumference --      Peak Flow --      Pain Score 07/10/20 1231 2     Pain Loc --      Pain Edu? --      Excl. in GC? --  No data found.  Updated Vital Signs BP 136/83   Pulse 90   Temp 98.2 F (36.8 C) (Oral)   Resp 16   SpO2 98%   Visual Acuity Right Eye Distance:   Left Eye Distance:   Bilateral Distance:    Right Eye Near:   Left Eye Near:    Bilateral Near:     Physical Exam Vitals and nursing note reviewed.  Constitutional:      General: She is not in acute distress.    Appearance: Normal appearance. She is not ill-appearing, toxic-appearing or diaphoretic.  HENT:     Head: Normocephalic.     Nose: Nose normal.  Eyes:     Conjunctiva/sclera: Conjunctivae normal.  Pulmonary:     Effort: Pulmonary effort is normal.  Musculoskeletal:        General: Normal range of motion.     Cervical back: Normal range of motion.  Skin:    General: Skin is warm and dry.     Findings: No rash.     Comments: Approximated 1 cm laceration to right upper arm, superficial.  Bleeding controlled.  Neurological:     Mental Status: She is alert.  Psychiatric:        Mood and Affect: Mood normal.      UC Treatments / Results   Labs (all labs ordered are listed, but only abnormal results are displayed) Labs Reviewed - No data to display  EKG   Radiology No results found.  Procedures Procedures (including critical care time)  Medications Ordered in UC Medications  Tdap (BOOSTRIX) injection 0.5 mL (0.5 mLs Intramuscular Given 07/10/20 1245)    Initial Impression / Assessment and Plan / UC Course  I have reviewed the triage vital signs and the nursing notes.  Pertinent labs & imaging results that were available during my care of the patient were reviewed by me and considered in my medical decision making (see chart for details).     Laceration Cleaned wound here and closed with Dermabond.  Tdap updated Follow up as needed for continued or worsening symptoms  Final Clinical Impressions(s) / UC Diagnoses   Final diagnoses:  Laceration of right upper extremity, initial encounter     Discharge Instructions     Tetanus updated We closed the wound with Dermabond today. Let this fall off on its own. Do not submerge in water but okay to get wet once completely dry Follow up as needed for continued or worsening symptoms     ED Prescriptions    None     PDMP not reviewed this encounter.   Janace Aris, NP 07/13/20 1007

## 2020-07-22 ENCOUNTER — Other Ambulatory Visit: Payer: Self-pay

## 2020-07-22 ENCOUNTER — Ambulatory Visit
Admission: RE | Admit: 2020-07-22 | Discharge: 2020-07-22 | Disposition: A | Discharge status: Home / Self Care | Payer: Medicare PPO | Source: Ambulatory Visit | Attending: Internal Medicine | Admitting: Internal Medicine

## 2020-07-22 DIAGNOSIS — Z780 Asymptomatic menopausal state: Secondary | ICD-10-CM | POA: Diagnosis not present

## 2020-07-22 DIAGNOSIS — E2839 Other primary ovarian failure: Secondary | ICD-10-CM

## 2020-09-15 DIAGNOSIS — Z7984 Long term (current) use of oral hypoglycemic drugs: Secondary | ICD-10-CM | POA: Diagnosis not present

## 2020-09-15 DIAGNOSIS — Z23 Encounter for immunization: Secondary | ICD-10-CM | POA: Diagnosis not present

## 2020-09-15 DIAGNOSIS — I10 Essential (primary) hypertension: Secondary | ICD-10-CM | POA: Diagnosis not present

## 2020-09-15 DIAGNOSIS — E782 Mixed hyperlipidemia: Secondary | ICD-10-CM | POA: Diagnosis not present

## 2020-09-15 DIAGNOSIS — E1169 Type 2 diabetes mellitus with other specified complication: Secondary | ICD-10-CM | POA: Diagnosis not present

## 2020-09-15 DIAGNOSIS — K5100 Ulcerative (chronic) pancolitis without complications: Secondary | ICD-10-CM | POA: Diagnosis not present

## 2020-11-06 DIAGNOSIS — Z20822 Contact with and (suspected) exposure to covid-19: Secondary | ICD-10-CM | POA: Diagnosis not present

## 2020-11-13 DIAGNOSIS — K51311 Ulcerative (chronic) rectosigmoiditis with rectal bleeding: Secondary | ICD-10-CM | POA: Diagnosis not present

## 2021-03-02 DIAGNOSIS — E1169 Type 2 diabetes mellitus with other specified complication: Secondary | ICD-10-CM | POA: Diagnosis not present

## 2021-03-02 DIAGNOSIS — Z1389 Encounter for screening for other disorder: Secondary | ICD-10-CM | POA: Diagnosis not present

## 2021-03-02 DIAGNOSIS — Z0000 Encounter for general adult medical examination without abnormal findings: Secondary | ICD-10-CM | POA: Diagnosis not present

## 2021-03-02 DIAGNOSIS — N952 Postmenopausal atrophic vaginitis: Secondary | ICD-10-CM | POA: Diagnosis not present

## 2021-03-02 DIAGNOSIS — I10 Essential (primary) hypertension: Secondary | ICD-10-CM | POA: Diagnosis not present

## 2021-03-02 DIAGNOSIS — Z7984 Long term (current) use of oral hypoglycemic drugs: Secondary | ICD-10-CM | POA: Diagnosis not present

## 2021-03-02 DIAGNOSIS — K5100 Ulcerative (chronic) pancolitis without complications: Secondary | ICD-10-CM | POA: Diagnosis not present

## 2021-03-02 DIAGNOSIS — E782 Mixed hyperlipidemia: Secondary | ICD-10-CM | POA: Diagnosis not present

## 2021-03-10 ENCOUNTER — Ambulatory Visit: Payer: Medicare PPO | Attending: Internal Medicine

## 2021-03-10 ENCOUNTER — Other Ambulatory Visit: Payer: Self-pay

## 2021-03-10 ENCOUNTER — Other Ambulatory Visit (HOSPITAL_BASED_OUTPATIENT_CLINIC_OR_DEPARTMENT_OTHER): Payer: Self-pay

## 2021-03-10 DIAGNOSIS — E1169 Type 2 diabetes mellitus with other specified complication: Secondary | ICD-10-CM | POA: Diagnosis not present

## 2021-03-10 DIAGNOSIS — Z23 Encounter for immunization: Secondary | ICD-10-CM

## 2021-03-10 DIAGNOSIS — Z7984 Long term (current) use of oral hypoglycemic drugs: Secondary | ICD-10-CM | POA: Diagnosis not present

## 2021-03-10 MED ORDER — PFIZER-BIONT COVID-19 VAC-TRIS 30 MCG/0.3ML IM SUSP
INTRAMUSCULAR | 0 refills | Status: AC
  Filled 2021-03-10: qty 0.3, 1d supply, fill #0

## 2021-03-10 NOTE — Progress Notes (Signed)
   Covid-19 Vaccination Clinic  Name:  Orissa Pickett [place]    MRN: [id number] DOB: 05-Apr-1951  03/10/2021  Ms. [place] was observed post Covid-19 immunization for 15 minutes without incident. She was provided with Vaccine Information Sheet and instruction to access the V-Safe system.   Ms. [place] was instructed to call 911 with any severe reactions post vaccine: Marland Kitchen Difficulty breathing  . Swelling of face and throat  . A fast heartbeat  . A bad rash all over body  . Dizziness and weakness   Immunizations Administered    Name Date Dose VIS Date Route   PFIZER Comrnaty(Gray TOP) Covid-19 Vaccine 03/10/2021  3:22 PM 0.3 mL 09/10/2020 Intramuscular   Manufacturer: [employer], [employer]   Lot: [id number]   NDC: [phone]

## 2021-03-11 ENCOUNTER — Other Ambulatory Visit (HOSPITAL_BASED_OUTPATIENT_CLINIC_OR_DEPARTMENT_OTHER): Payer: Self-pay

## 2021-03-18 DIAGNOSIS — Z20822 Contact with and (suspected) exposure to covid-19: Secondary | ICD-10-CM | POA: Diagnosis not present

## 2021-03-19 DIAGNOSIS — Z7189 Other specified counseling: Secondary | ICD-10-CM | POA: Diagnosis not present

## 2021-03-19 DIAGNOSIS — U071 COVID-19: Secondary | ICD-10-CM | POA: Diagnosis not present

## 2021-03-21 DIAGNOSIS — Z20822 Contact with and (suspected) exposure to covid-19: Secondary | ICD-10-CM | POA: Diagnosis not present

## 2021-05-12 DIAGNOSIS — N644 Mastodynia: Secondary | ICD-10-CM | POA: Diagnosis not present

## 2021-08-24 DIAGNOSIS — S00451A Superficial foreign body of right ear, initial encounter: Secondary | ICD-10-CM | POA: Diagnosis not present

## 2021-08-24 DIAGNOSIS — S00452A Superficial foreign body of left ear, initial encounter: Secondary | ICD-10-CM | POA: Diagnosis not present

## 2021-12-14 DIAGNOSIS — J029 Acute pharyngitis, unspecified: Secondary | ICD-10-CM | POA: Diagnosis not present

## 2021-12-14 DIAGNOSIS — Z20822 Contact with and (suspected) exposure to covid-19: Secondary | ICD-10-CM | POA: Diagnosis not present

## 2021-12-14 DIAGNOSIS — Z03818 Encounter for observation for suspected exposure to other biological agents ruled out: Secondary | ICD-10-CM | POA: Diagnosis not present

## 2022-02-14 DIAGNOSIS — E1169 Type 2 diabetes mellitus with other specified complication: Secondary | ICD-10-CM | POA: Diagnosis not present

## 2022-02-14 DIAGNOSIS — R202 Paresthesia of skin: Secondary | ICD-10-CM | POA: Diagnosis not present

## 2022-03-25 DIAGNOSIS — I10 Essential (primary) hypertension: Secondary | ICD-10-CM | POA: Diagnosis not present

## 2022-03-25 DIAGNOSIS — E1169 Type 2 diabetes mellitus with other specified complication: Secondary | ICD-10-CM | POA: Diagnosis not present

## 2022-03-25 DIAGNOSIS — E538 Deficiency of other specified B group vitamins: Secondary | ICD-10-CM | POA: Diagnosis not present

## 2022-03-25 DIAGNOSIS — K219 Gastro-esophageal reflux disease without esophagitis: Secondary | ICD-10-CM | POA: Diagnosis not present

## 2022-03-25 DIAGNOSIS — E782 Mixed hyperlipidemia: Secondary | ICD-10-CM | POA: Diagnosis not present

## 2022-03-25 DIAGNOSIS — Z1331 Encounter for screening for depression: Secondary | ICD-10-CM | POA: Diagnosis not present

## 2022-03-25 DIAGNOSIS — Z0000 Encounter for general adult medical examination without abnormal findings: Secondary | ICD-10-CM | POA: Diagnosis not present

## 2022-03-25 DIAGNOSIS — K5100 Ulcerative (chronic) pancolitis without complications: Secondary | ICD-10-CM | POA: Diagnosis not present

## 2022-04-11 DIAGNOSIS — L819 Disorder of pigmentation, unspecified: Secondary | ICD-10-CM | POA: Diagnosis not present

## 2022-04-11 DIAGNOSIS — L811 Chloasma: Secondary | ICD-10-CM | POA: Diagnosis not present

## 2022-04-11 DIAGNOSIS — L270 Generalized skin eruption due to drugs and medicaments taken internally: Secondary | ICD-10-CM | POA: Diagnosis not present

## 2022-04-15 DIAGNOSIS — K6389 Other specified diseases of intestine: Secondary | ICD-10-CM | POA: Diagnosis not present

## 2022-04-15 DIAGNOSIS — K5190 Ulcerative colitis, unspecified, without complications: Secondary | ICD-10-CM | POA: Diagnosis not present

## 2022-04-15 DIAGNOSIS — K640 First degree hemorrhoids: Secondary | ICD-10-CM | POA: Diagnosis not present

## 2022-04-15 DIAGNOSIS — Z1211 Encounter for screening for malignant neoplasm of colon: Secondary | ICD-10-CM | POA: Diagnosis not present

## 2022-04-15 DIAGNOSIS — K5289 Other specified noninfective gastroenteritis and colitis: Secondary | ICD-10-CM | POA: Diagnosis not present

## 2022-04-15 DIAGNOSIS — K529 Noninfective gastroenteritis and colitis, unspecified: Secondary | ICD-10-CM | POA: Diagnosis not present

## 2022-05-16 DIAGNOSIS — Z1231 Encounter for screening mammogram for malignant neoplasm of breast: Secondary | ICD-10-CM | POA: Diagnosis not present

## 2022-06-07 DIAGNOSIS — K51311 Ulcerative (chronic) rectosigmoiditis with rectal bleeding: Secondary | ICD-10-CM | POA: Diagnosis not present

## 2022-07-15 DIAGNOSIS — H40033 Anatomical narrow angle, bilateral: Secondary | ICD-10-CM | POA: Diagnosis not present

## 2022-07-15 DIAGNOSIS — E119 Type 2 diabetes mellitus without complications: Secondary | ICD-10-CM | POA: Diagnosis not present

## 2022-08-22 DIAGNOSIS — K635 Polyp of colon: Secondary | ICD-10-CM | POA: Diagnosis not present

## 2022-08-22 DIAGNOSIS — K51318 Ulcerative (chronic) rectosigmoiditis with other complication: Secondary | ICD-10-CM | POA: Diagnosis not present

## 2022-08-22 DIAGNOSIS — K621 Rectal polyp: Secondary | ICD-10-CM | POA: Diagnosis not present

## 2022-08-22 DIAGNOSIS — R7309 Other abnormal glucose: Secondary | ICD-10-CM | POA: Diagnosis not present

## 2022-08-22 DIAGNOSIS — K51311 Ulcerative (chronic) rectosigmoiditis with rectal bleeding: Secondary | ICD-10-CM | POA: Diagnosis not present

## 2022-08-22 DIAGNOSIS — K51918 Ulcerative colitis, unspecified with other complication: Secondary | ICD-10-CM | POA: Diagnosis not present

## 2022-10-13 DIAGNOSIS — K51311 Ulcerative (chronic) rectosigmoiditis with rectal bleeding: Secondary | ICD-10-CM | POA: Diagnosis not present

## 2022-10-13 DIAGNOSIS — K5190 Ulcerative colitis, unspecified, without complications: Secondary | ICD-10-CM | POA: Diagnosis not present

## 2022-10-13 DIAGNOSIS — D126 Benign neoplasm of colon, unspecified: Secondary | ICD-10-CM | POA: Diagnosis not present

## 2022-10-25 DIAGNOSIS — K621 Rectal polyp: Secondary | ICD-10-CM | POA: Diagnosis not present

## 2022-10-25 DIAGNOSIS — K219 Gastro-esophageal reflux disease without esophagitis: Secondary | ICD-10-CM | POA: Diagnosis not present

## 2022-10-25 DIAGNOSIS — Z9889 Other specified postprocedural states: Secondary | ICD-10-CM | POA: Diagnosis not present

## 2022-10-25 DIAGNOSIS — K6282 Dysplasia of anus: Secondary | ICD-10-CM | POA: Diagnosis not present

## 2022-10-25 DIAGNOSIS — E785 Hyperlipidemia, unspecified: Secondary | ICD-10-CM | POA: Diagnosis not present

## 2022-10-25 DIAGNOSIS — K5190 Ulcerative colitis, unspecified, without complications: Secondary | ICD-10-CM | POA: Diagnosis not present

## 2022-10-25 DIAGNOSIS — E119 Type 2 diabetes mellitus without complications: Secondary | ICD-10-CM | POA: Diagnosis not present

## 2022-10-25 DIAGNOSIS — K629 Disease of anus and rectum, unspecified: Secondary | ICD-10-CM | POA: Diagnosis not present

## 2022-10-25 DIAGNOSIS — Z7984 Long term (current) use of oral hypoglycemic drugs: Secondary | ICD-10-CM | POA: Diagnosis not present

## 2022-10-25 DIAGNOSIS — I10 Essential (primary) hypertension: Secondary | ICD-10-CM | POA: Diagnosis not present

## 2022-10-25 DIAGNOSIS — K5130 Ulcerative (chronic) rectosigmoiditis without complications: Secondary | ICD-10-CM | POA: Diagnosis not present

## 2022-10-26 DIAGNOSIS — K5130 Ulcerative (chronic) rectosigmoiditis without complications: Secondary | ICD-10-CM | POA: Diagnosis not present

## 2022-10-26 DIAGNOSIS — K621 Rectal polyp: Secondary | ICD-10-CM | POA: Diagnosis not present

## 2022-10-26 DIAGNOSIS — K6289 Other specified diseases of anus and rectum: Secondary | ICD-10-CM | POA: Diagnosis not present

## 2022-10-26 DIAGNOSIS — K629 Disease of anus and rectum, unspecified: Secondary | ICD-10-CM | POA: Diagnosis not present

## 2022-10-26 DIAGNOSIS — K219 Gastro-esophageal reflux disease without esophagitis: Secondary | ICD-10-CM | POA: Diagnosis not present

## 2022-10-26 DIAGNOSIS — I10 Essential (primary) hypertension: Secondary | ICD-10-CM | POA: Diagnosis not present

## 2022-10-26 DIAGNOSIS — E785 Hyperlipidemia, unspecified: Secondary | ICD-10-CM | POA: Diagnosis not present

## 2022-10-26 DIAGNOSIS — E119 Type 2 diabetes mellitus without complications: Secondary | ICD-10-CM | POA: Diagnosis not present

## 2022-10-26 DIAGNOSIS — Z9889 Other specified postprocedural states: Secondary | ICD-10-CM | POA: Diagnosis not present

## 2022-11-09 DIAGNOSIS — D649 Anemia, unspecified: Secondary | ICD-10-CM | POA: Diagnosis not present

## 2022-11-09 DIAGNOSIS — N1831 Chronic kidney disease, stage 3a: Secondary | ICD-10-CM | POA: Diagnosis not present

## 2022-11-09 DIAGNOSIS — K5100 Ulcerative (chronic) pancolitis without complications: Secondary | ICD-10-CM | POA: Diagnosis not present

## 2022-11-09 DIAGNOSIS — K621 Rectal polyp: Secondary | ICD-10-CM | POA: Diagnosis not present

## 2022-11-09 DIAGNOSIS — E1169 Type 2 diabetes mellitus with other specified complication: Secondary | ICD-10-CM | POA: Diagnosis not present

## 2022-12-02 DIAGNOSIS — Z01419 Encounter for gynecological examination (general) (routine) without abnormal findings: Secondary | ICD-10-CM | POA: Diagnosis not present

## 2022-12-02 DIAGNOSIS — N898 Other specified noninflammatory disorders of vagina: Secondary | ICD-10-CM | POA: Diagnosis not present

## 2022-12-02 DIAGNOSIS — N952 Postmenopausal atrophic vaginitis: Secondary | ICD-10-CM | POA: Diagnosis not present

## 2022-12-05 DIAGNOSIS — K5100 Ulcerative (chronic) pancolitis without complications: Secondary | ICD-10-CM | POA: Diagnosis not present

## 2022-12-05 DIAGNOSIS — E1169 Type 2 diabetes mellitus with other specified complication: Secondary | ICD-10-CM | POA: Diagnosis not present

## 2022-12-05 DIAGNOSIS — N1831 Chronic kidney disease, stage 3a: Secondary | ICD-10-CM | POA: Diagnosis not present

## 2022-12-26 DIAGNOSIS — L819 Disorder of pigmentation, unspecified: Secondary | ICD-10-CM | POA: Diagnosis not present

## 2022-12-26 DIAGNOSIS — L811 Chloasma: Secondary | ICD-10-CM | POA: Diagnosis not present

## 2022-12-26 DIAGNOSIS — L83 Acanthosis nigricans: Secondary | ICD-10-CM | POA: Diagnosis not present

## 2023-02-14 DIAGNOSIS — Z79899 Other long term (current) drug therapy: Secondary | ICD-10-CM | POA: Diagnosis not present

## 2023-02-14 DIAGNOSIS — K5190 Ulcerative colitis, unspecified, without complications: Secondary | ICD-10-CM | POA: Diagnosis not present

## 2023-02-14 DIAGNOSIS — Z85048 Personal history of other malignant neoplasm of rectum, rectosigmoid junction, and anus: Secondary | ICD-10-CM | POA: Diagnosis not present

## 2023-02-14 DIAGNOSIS — I10 Essential (primary) hypertension: Secondary | ICD-10-CM | POA: Diagnosis not present

## 2023-02-14 DIAGNOSIS — K2100 Gastro-esophageal reflux disease with esophagitis, without bleeding: Secondary | ICD-10-CM | POA: Diagnosis not present

## 2023-02-14 DIAGNOSIS — Z7984 Long term (current) use of oral hypoglycemic drugs: Secondary | ICD-10-CM | POA: Diagnosis not present

## 2023-02-14 DIAGNOSIS — D128 Benign neoplasm of rectum: Secondary | ICD-10-CM | POA: Diagnosis not present

## 2023-02-14 DIAGNOSIS — Z1211 Encounter for screening for malignant neoplasm of colon: Secondary | ICD-10-CM | POA: Diagnosis not present

## 2023-03-31 DIAGNOSIS — E782 Mixed hyperlipidemia: Secondary | ICD-10-CM | POA: Diagnosis not present

## 2023-03-31 DIAGNOSIS — N1831 Chronic kidney disease, stage 3a: Secondary | ICD-10-CM | POA: Diagnosis not present

## 2023-03-31 DIAGNOSIS — Z0000 Encounter for general adult medical examination without abnormal findings: Secondary | ICD-10-CM | POA: Diagnosis not present

## 2023-03-31 DIAGNOSIS — D128 Benign neoplasm of rectum: Secondary | ICD-10-CM | POA: Diagnosis not present

## 2023-03-31 DIAGNOSIS — Z1331 Encounter for screening for depression: Secondary | ICD-10-CM | POA: Diagnosis not present

## 2023-03-31 DIAGNOSIS — E1122 Type 2 diabetes mellitus with diabetic chronic kidney disease: Secondary | ICD-10-CM | POA: Diagnosis not present

## 2023-03-31 DIAGNOSIS — I10 Essential (primary) hypertension: Secondary | ICD-10-CM | POA: Diagnosis not present

## 2023-03-31 DIAGNOSIS — K5100 Ulcerative (chronic) pancolitis without complications: Secondary | ICD-10-CM | POA: Diagnosis not present

## 2023-05-22 DIAGNOSIS — Z1231 Encounter for screening mammogram for malignant neoplasm of breast: Secondary | ICD-10-CM | POA: Diagnosis not present

## 2023-05-29 DIAGNOSIS — D128 Benign neoplasm of rectum: Secondary | ICD-10-CM | POA: Diagnosis not present

## 2023-05-29 DIAGNOSIS — E119 Type 2 diabetes mellitus without complications: Secondary | ICD-10-CM | POA: Diagnosis not present

## 2023-05-29 DIAGNOSIS — Z7984 Long term (current) use of oral hypoglycemic drugs: Secondary | ICD-10-CM | POA: Diagnosis not present

## 2023-05-29 DIAGNOSIS — Z8719 Personal history of other diseases of the digestive system: Secondary | ICD-10-CM | POA: Diagnosis not present

## 2023-05-29 DIAGNOSIS — K51311 Ulcerative (chronic) rectosigmoiditis with rectal bleeding: Secondary | ICD-10-CM | POA: Diagnosis not present

## 2023-06-26 ENCOUNTER — Other Ambulatory Visit (HOSPITAL_BASED_OUTPATIENT_CLINIC_OR_DEPARTMENT_OTHER): Payer: Self-pay

## 2023-06-26 MED ORDER — COVID-19 MRNA VAC-TRIS(PFIZER) 30 MCG/0.3ML IM SUSY
0.3000 mL | PREFILLED_SYRINGE | Freq: Once | INTRAMUSCULAR | 0 refills | Status: AC
  Filled 2023-06-26: qty 0.3, 1d supply, fill #0

## 2023-07-10 DIAGNOSIS — L811 Chloasma: Secondary | ICD-10-CM | POA: Diagnosis not present

## 2023-07-10 DIAGNOSIS — L574 Cutis laxa senilis: Secondary | ICD-10-CM | POA: Diagnosis not present

## 2023-10-02 DIAGNOSIS — K51311 Ulcerative (chronic) rectosigmoiditis with rectal bleeding: Secondary | ICD-10-CM | POA: Diagnosis not present

## 2023-10-02 DIAGNOSIS — Z860100 Personal history of colon polyps, unspecified: Secondary | ICD-10-CM | POA: Diagnosis not present

## 2023-10-02 DIAGNOSIS — K529 Noninfective gastroenteritis and colitis, unspecified: Secondary | ICD-10-CM | POA: Diagnosis not present

## 2023-10-02 DIAGNOSIS — K6289 Other specified diseases of anus and rectum: Secondary | ICD-10-CM | POA: Diagnosis not present

## 2023-10-02 DIAGNOSIS — K5289 Other specified noninfective gastroenteritis and colitis: Secondary | ICD-10-CM | POA: Diagnosis not present

## 2023-10-09 DIAGNOSIS — N1831 Chronic kidney disease, stage 3a: Secondary | ICD-10-CM | POA: Diagnosis not present

## 2023-10-09 DIAGNOSIS — I10 Essential (primary) hypertension: Secondary | ICD-10-CM | POA: Diagnosis not present

## 2023-10-09 DIAGNOSIS — E1122 Type 2 diabetes mellitus with diabetic chronic kidney disease: Secondary | ICD-10-CM | POA: Diagnosis not present

## 2023-10-09 DIAGNOSIS — Q439 Congenital malformation of intestine, unspecified: Secondary | ICD-10-CM | POA: Diagnosis not present

## 2023-10-09 DIAGNOSIS — K5100 Ulcerative (chronic) pancolitis without complications: Secondary | ICD-10-CM | POA: Diagnosis not present

## 2023-10-09 DIAGNOSIS — E782 Mixed hyperlipidemia: Secondary | ICD-10-CM | POA: Diagnosis not present

## 2023-11-27 DIAGNOSIS — K625 Hemorrhage of anus and rectum: Secondary | ICD-10-CM | POA: Diagnosis not present

## 2023-11-27 DIAGNOSIS — R197 Diarrhea, unspecified: Secondary | ICD-10-CM | POA: Diagnosis not present

## 2023-11-27 DIAGNOSIS — K51311 Ulcerative (chronic) rectosigmoiditis with rectal bleeding: Secondary | ICD-10-CM | POA: Diagnosis not present

## 2024-03-15 DIAGNOSIS — K6389 Other specified diseases of intestine: Secondary | ICD-10-CM | POA: Diagnosis not present

## 2024-03-15 DIAGNOSIS — K51311 Ulcerative (chronic) rectosigmoiditis with rectal bleeding: Secondary | ICD-10-CM | POA: Diagnosis not present

## 2024-03-15 DIAGNOSIS — K6289 Other specified diseases of anus and rectum: Secondary | ICD-10-CM | POA: Diagnosis not present

## 2024-03-15 DIAGNOSIS — K51011 Ulcerative (chronic) pancolitis with rectal bleeding: Secondary | ICD-10-CM | POA: Diagnosis not present

## 2024-05-21 DIAGNOSIS — K5100 Ulcerative (chronic) pancolitis without complications: Secondary | ICD-10-CM | POA: Diagnosis not present

## 2024-05-21 DIAGNOSIS — D128 Benign neoplasm of rectum: Secondary | ICD-10-CM | POA: Diagnosis not present

## 2024-07-22 DIAGNOSIS — Z1331 Encounter for screening for depression: Secondary | ICD-10-CM | POA: Diagnosis not present

## 2024-07-22 DIAGNOSIS — E1122 Type 2 diabetes mellitus with diabetic chronic kidney disease: Secondary | ICD-10-CM | POA: Diagnosis not present

## 2024-07-22 DIAGNOSIS — E782 Mixed hyperlipidemia: Secondary | ICD-10-CM | POA: Diagnosis not present

## 2024-07-22 DIAGNOSIS — K5190 Ulcerative colitis, unspecified, without complications: Secondary | ICD-10-CM | POA: Diagnosis not present

## 2024-07-22 DIAGNOSIS — I10 Essential (primary) hypertension: Secondary | ICD-10-CM | POA: Diagnosis not present

## 2024-07-22 DIAGNOSIS — N1831 Chronic kidney disease, stage 3a: Secondary | ICD-10-CM | POA: Diagnosis not present

## 2024-07-22 DIAGNOSIS — Z1231 Encounter for screening mammogram for malignant neoplasm of breast: Secondary | ICD-10-CM | POA: Diagnosis not present

## 2024-07-22 DIAGNOSIS — Z0000 Encounter for general adult medical examination without abnormal findings: Secondary | ICD-10-CM | POA: Diagnosis not present

## 2024-07-22 DIAGNOSIS — E538 Deficiency of other specified B group vitamins: Secondary | ICD-10-CM | POA: Diagnosis not present

## 2024-08-01 ENCOUNTER — Other Ambulatory Visit (HOSPITAL_BASED_OUTPATIENT_CLINIC_OR_DEPARTMENT_OTHER): Payer: Self-pay | Admitting: Internal Medicine

## 2024-08-01 DIAGNOSIS — E782 Mixed hyperlipidemia: Secondary | ICD-10-CM | POA: Diagnosis not present

## 2024-08-01 DIAGNOSIS — I708 Atherosclerosis of other arteries: Secondary | ICD-10-CM

## 2024-08-09 ENCOUNTER — Ambulatory Visit (HOSPITAL_BASED_OUTPATIENT_CLINIC_OR_DEPARTMENT_OTHER)
Admission: RE | Admit: 2024-08-09 | Discharge: 2024-08-09 | Disposition: A | Discharge status: Home / Self Care | Payer: Self-pay | Source: Ambulatory Visit | Attending: Internal Medicine | Admitting: Internal Medicine

## 2024-08-09 DIAGNOSIS — I708 Atherosclerosis of other arteries: Secondary | ICD-10-CM

## 2025-04-01 ENCOUNTER — Ambulatory Visit: Admitting: Dermatology
